# Patient Record
Sex: Male | Born: 1945 | Race: White | Hispanic: No | Marital: Married | Smoking: Never smoker
Health system: Southern US, Community
[De-identification: ages and names within clinical notes are randomized; demographics above are authoritative.]

---

## 2000-08-30 ENCOUNTER — Encounter: Payer: Self-pay | Admitting: Specialist

## 2000-08-30 ENCOUNTER — Ambulatory Visit (HOSPITAL_COMMUNITY): Admission: RE | Admit: 2000-08-30 | Discharge: 2000-08-30 | Payer: Self-pay | Admitting: Specialist

## 2008-10-12 ENCOUNTER — Inpatient Hospital Stay (HOSPITAL_COMMUNITY): Admission: RE | Admit: 2008-10-12 | Discharge: 2008-10-14 | Payer: Self-pay | Admitting: Orthopedic Surgery

## 2009-10-08 IMAGING — CR DG CHEST 2V
2 series · 2 of 2 positions shown · non-contrast
Comparison: None

CLINICAL DATA: Preoperative respiratory examination for left total
knee replacement 10/12/2008.

CHEST - 2 VIEW

[w chest pa]
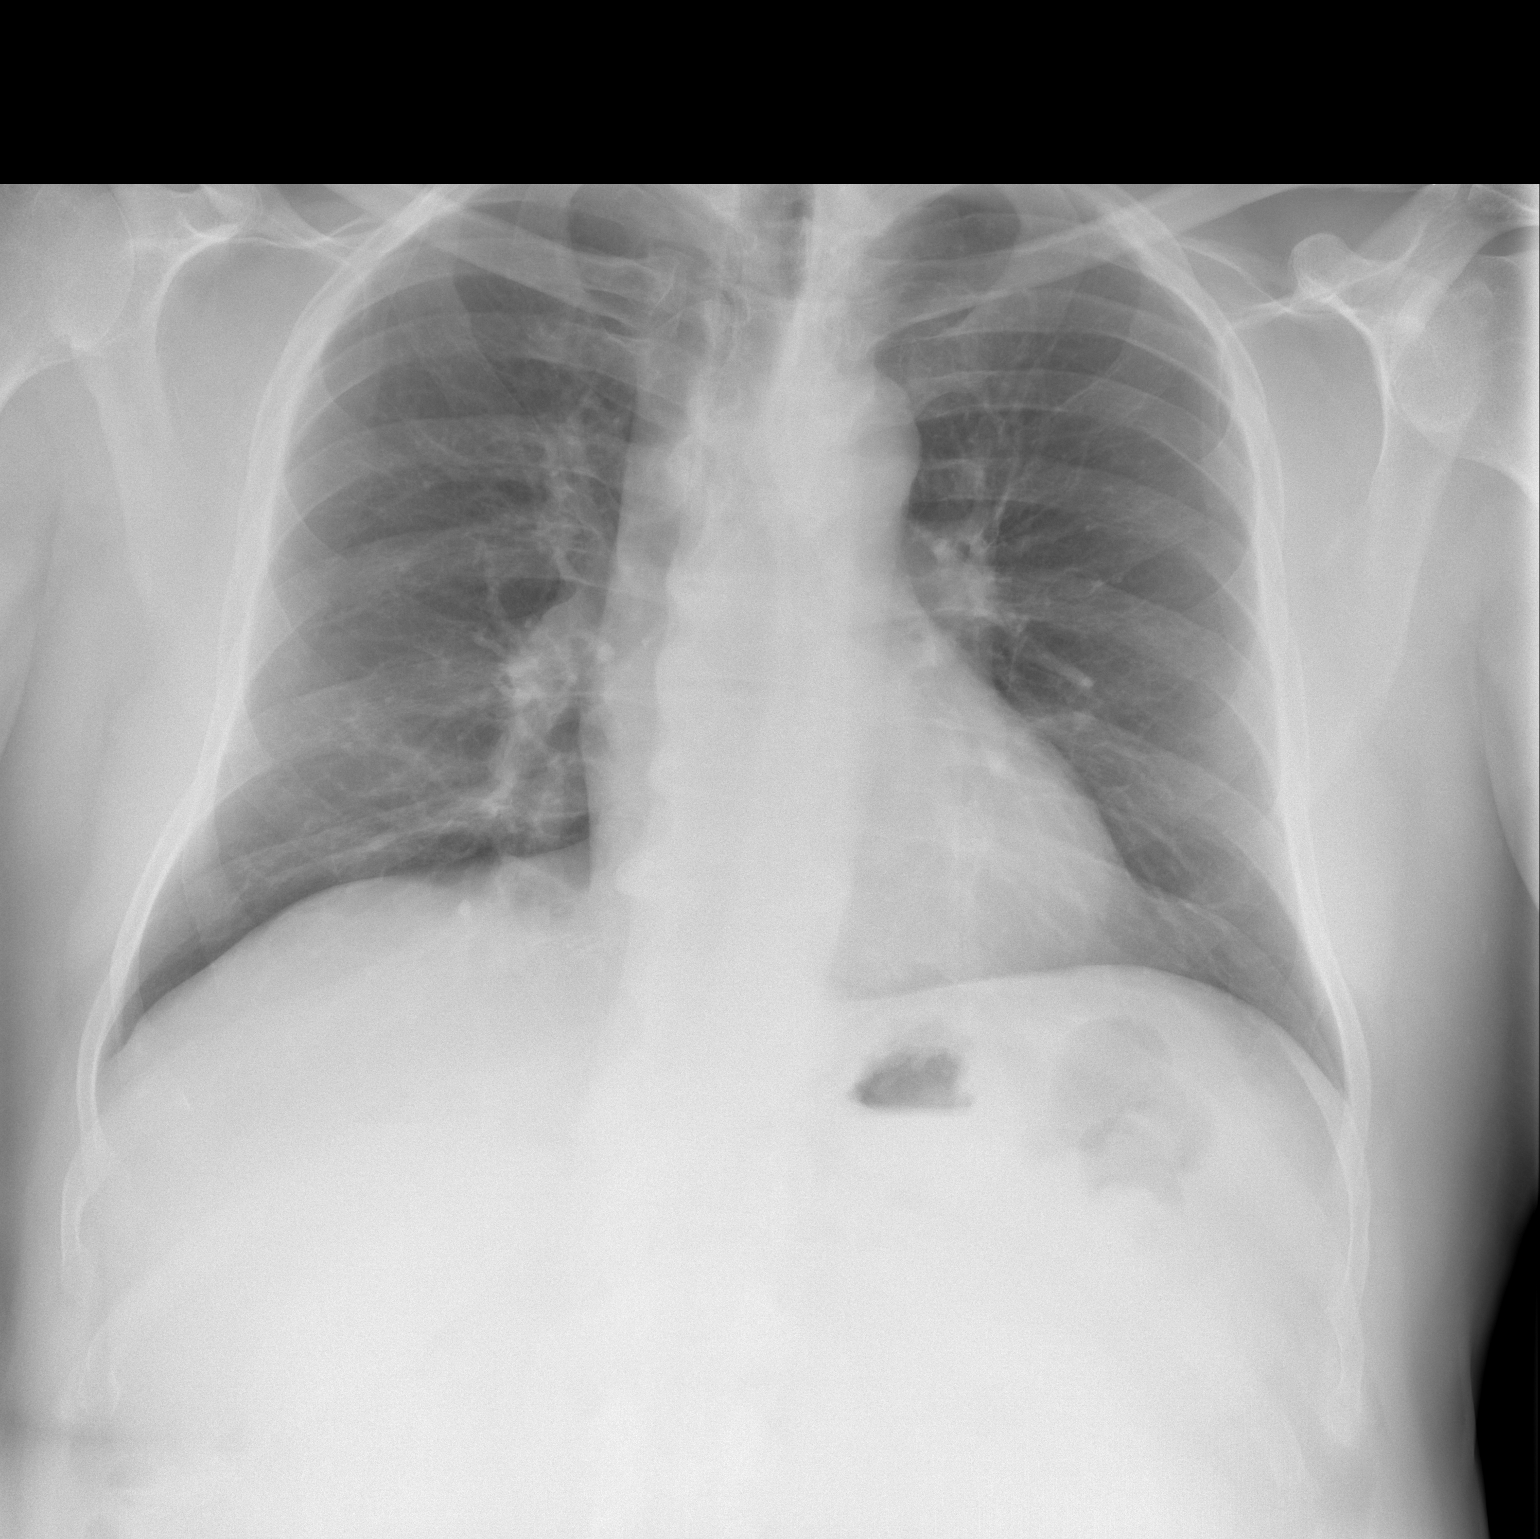

[w chest lat]
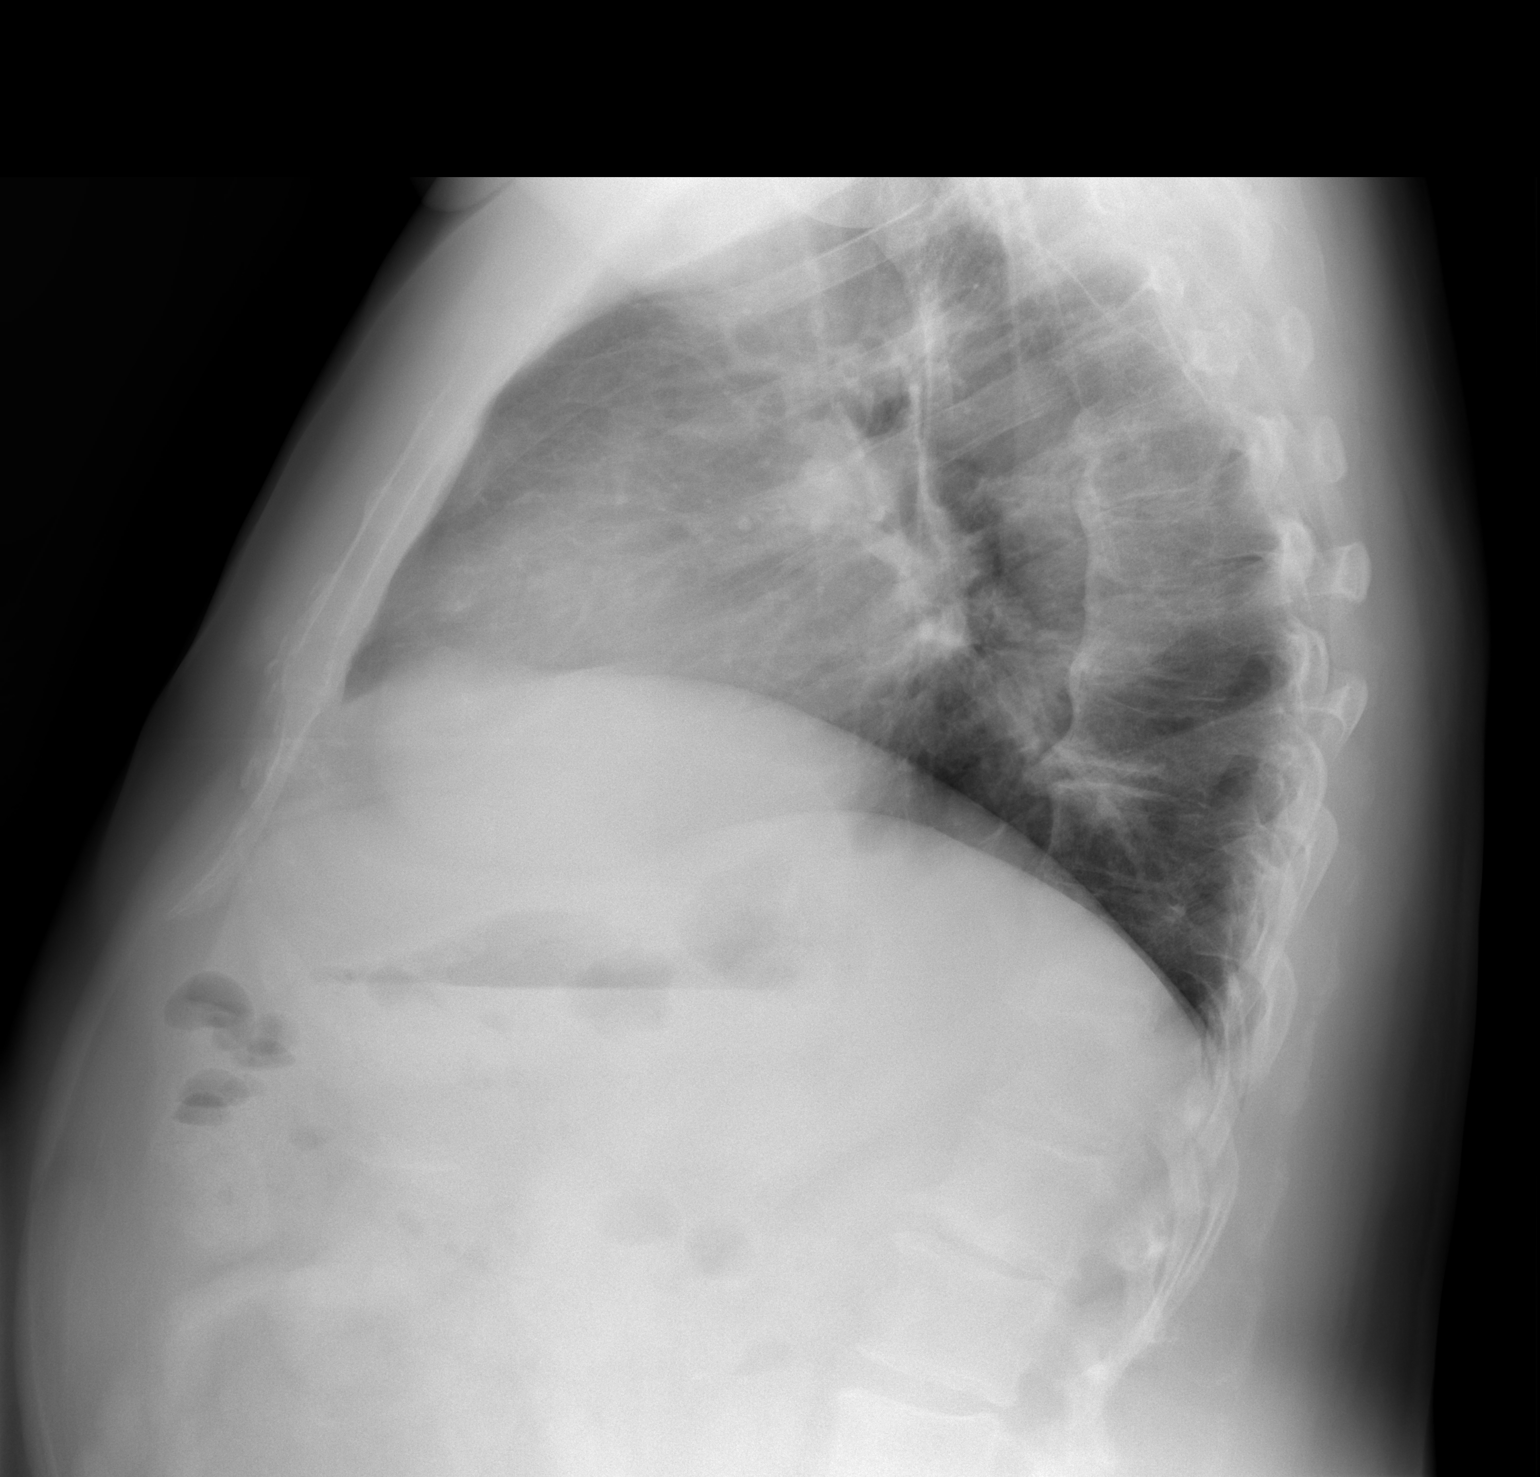

[2 of 2 positions shown; findings below may reference images not displayed]

FINDINGS: The heart size and mediastinal contours are normal for
age and the degree of inspiration.  The lungs are clear.  No
pleural effusion.  Scattered thoracic paraspinal osteophytes are
noted.
IMPRESSION: No active cardiopulmonary process.

## 2011-05-15 NOTE — Op Note (Signed)
Patrick Dawson, Patrick Dawson                 ACCOUNT NO.:  [id number]   MEDICAL RECORD NO.:  [id number]          PATIENT TYPE:  INP   LOCATION:  0004                         FACILITY:  [facility]   PHYSICIAN:  Madlyn Frankel. Charlann Boxer, M.D.  DATE OF BIRTH:  07/18/1946   DATE OF PROCEDURE:  10/12/2008  DATE OF DISCHARGE:                               OPERATIVE REPORT   PREOPERATIVE DIAGNOSIS:  Left knee osteoarthritis.   POSTOPERATIVE DIAGNOSIS:  Left knee osteoarthritis.   PROCEDURE:  Left total knee replacement.   COMPONENTS USED:  DePuy rotating platform posterior stabilized knee  system, size 4 femur, 5 tibia, 12.5 insert with 41 patellar button.   SURGEON:  Madlyn Frankel. Charlann Boxer, M.D.   ASSISTANT:  Yetta Glassman. Mann, PA   ANESTHESIA:  Duramorph spinal.   TOURNIQUET TIME:  45 minutes 250 mmHg.   DRAINS:  One Hemovac.   COMPLICATIONS:  None.   INDICATIONS FOR PROCEDURE:  Patrick Dawson is a pleasant 65 year old gentleman  who presented to the office with end-stage left greater than right  bilateral knee osteoarthritis with limitation of range of motion due to  severe osteophytes noted.  He had failed conservative measures and  wished to proceed with knee replacement surgery for the benefits of pain  relief.  Risks of infection, DVT postoperative stiffness, need for  revision surgery any purposes were all discussed and reviewed,  particularly given his age.  Consent was obtained.   PROCEDURE IN DETAIL:  The patient was brought to the operative theater.  Once adequate anesthesia, preoperative antibiotics, Ancef, 2 grams,  administered, the patient was positioned supine and the left thigh  tourniquet placed.  Left lower extremity was pre scrubbed and prepped  and draped in sterile fashion.  The leg was exsanguinated, tourniquet  elevated to 250 mmHg.  A midline incision was made followed by medial  arthrotomy.  Following initial debridement, attention was directed  patella.  Precut measurement was 26 mm.  I  resected down to  approximately 14 mm and used a 41 patellar button.  I checked with a  caliper afterwards and it sat at 26 mm.   Attention was now directed the femur.  Femoral canal was opened with  drill, irrigated to prevent fat emboli.  An intramedullary rod was then  passed in 3 degrees valgus.  I resected 11 mm bone off the distal femur  due to a 5 to 7 degree flexion contracture.   Following this cut, I attended to the tibia.  Tibia was subluxated  anteriorly.  Extramedullary guide was utilized and I resected 10 mm bone  off the proximal lateral tibia.  Following this cut, I checked the knee  in extension and was satisfied I was going to get the 10 mm insert  place, particularly knowing that there were significant osteophytes  posteriorly.   At this point, I checked the cut surface the tibia with a 4 tibial tray.  I was happy that the cut was perpendicular in both planes.  Once I  determined this, I attended to the femur again.   Femoral rotation was set  based off the proximal tibial cut, using a size  10 shim.  The rotation block was pinned in position and exchanged out  for the 4-in-1 cutting block.   I checked with a crab claw to make sure there would be no notching and  then made the anterior, posterior and chamfer cuts.  There were no  complications or notching.  Final box cut was made based off the lateral  aspect distal femur.  From anterior-posterior dimension, this patient  would tolerate a size 4 femur well.  Medial laterally it could have been  a little bit larger but I was happy with overall balancing at this point  and thus stayed with this size.   Attention was redirected back to the tibia.  Prior to performing the  tibial drilling and keel punch, I used a laminar spreader and used a 5/8  osteotome to remove large osteophytes off the posterior medial and  lateral aspect of the femur.  Once this was done and palpably clear of  any problems, I placed a size 5  tibial tray on the cut surfaces.  It fit  best along the cortical surface even after I debride osteophytes  medially.  I drilled and keel punched it and now did a trial reduction.  With a trial reduction I found a 12.5 insert felt better from extension  into flexion without evidence of any instability.  The patella tracked  through the trochlea without any application of pressure and no tilting.  At this point all trial components were removed.  Final debridement was  carried out as necessary.  The synovial capsule layer was injected with  0.25% Marcaine with epinephrine and 1 mL of Toradol.  The final  components were opened and cement mixed.   Final components were cemented in position.  The knee was brought to  extension with a 12.5 insert and extruded cement was removed.  Once  cement cured, I removed remaining cement out of the posterior aspect of  the knee.  Once I was satisfied there was no cement that would cause  problems, the final 12.5 insert to match the size 4 femur was inserted.  There appeared to be a good snap with the knee in flexion indicating  good gap balancing.   At this point we reirrigated the knee with normal saline solution with  pulse lavage.  I then placed a medium Hemovac drain deep.  The  tourniquet was let down at 45 minutes.  The extensor mechanism was then  reapproximated using #1 Vicryl with the knee in the flexion.  The  remainder of the wound was closed in layers with 2-0 Vicryl and running  4-0 Monocryl.  The knee was cleaned, dried and dressed sterilely with a  sterile bulky wrap.  He was then brought to the recovery room in stable  condition tolerating the procedure well.      Madlyn Frankel Charlann Boxer, M.D.  Electronically Signed     MDO/MEDQ  D:  10/12/2008  T:  10/12/2008  Job:  [id number]

## 2011-05-15 NOTE — H&P (Signed)
Patrick Dawson, Patrick Dawson                 ACCOUNT NO.:  [id number]   MEDICAL RECORD NO.:  [id number]          PATIENT TYPE:  INP   LOCATION:                               FACILITY:  [facility]   PHYSICIAN:  Madlyn Frankel. Charlann Boxer, M.D.  DATE OF BIRTH:  02-Feb-1946   DATE OF ADMISSION:  10/12/2008  DATE OF DISCHARGE:                              HISTORY & PHYSICAL   PROCEDURE:  Left total knee replacement.   CHIEF COMPLAINT:  Left knee pain.   HISTORY OF PRESENT ILLNESS:  A 65 year old male with a history of left  knee pain secondary to osteoarthritis with flexion contracture, limited  range of motion.  It has been refractory to all conservative treatment  including oral anti-inflammatories and cortisone injections.  He has  been presurgically assessed by his primary care physician, Dr. Lenn Sink.   PAST MEDICAL HISTORY:  Significant for:  1. Osteoarthritis.  2. Hypertension.  3. Heart attack in 2000.  4. Kidney stones.   PAST SURGICAL HISTORY:  1. Hernia repair in 1996.  2. Right foot surgery in 1968 and 1998.   FAMILY HISTORY:  Heart disease.   SOCIAL HISTORY:  Married, nonsmoker.  Primary caregiver in the home  postoperatively.   DRUG ALLERGIES:  TALWIN.   MEDICATIONS:  Unknown at the time of history and physical.  Will fax or  bring medications at the time of admission.   REVIEW OF SYSTEMS:  HEENT:  Has some hearing loss.  RESPIRATORY:  Has  shortness of breath on exertion.  MUSCULOSKELETAL:  Has morning  stiffness.  Otherwise, see HPI.   PHYSICAL EXAMINATION:  VITAL SIGNS:  Pulse 72, respirations 16, blood  pressure 132/78.  GENERAL:  Awake, alert and oriented.  Well-developed, well-nourished.  He is 5 feet, 10 inches, 240 pounds.  NECK:  Supple.  No carotid bruits.  CHEST:  Lungs clear to auscultation bilaterally.  BREASTS:  Deferred.  HEART:  Regular rate and rhythm.  S1, S2 distinct.  ABDOMEN:  Soft, nontender, nondistended.  Bowel sounds present.  GENITOURINARY:   Deferred.  EXTREMITIES:  Left lower extremity range of motion 3 to 90 with medial  sided tenderness.  SKIN:  No cellulitis.  NEUROLOGICAL:  Intact distal sensibilities.   LABORATORY DATA:  His labs, EKG and chest x-ray are all pending  presurgical testing.   IMPRESSION:  Left knee osteoarthritis.   PLAN OF ACTION:  Left total knee replacement at [facility]  October 12, 2008 by surgeon Dr. Durene Romans.  Risks and complications  were discussed.   Postoperative medications including Lovenox, Robaxin, iron, aspirin,  Colace, MiraLax were provided at the time of history and physical.  In  addition, Celebrex to be utilized perioperatively for one p.o. b.i.d. x2  weeks.     ______________________________  Yetta Glassman [address], [place]      Madlyn Frankel. Charlann Boxer, M.D.  Electronically Signed    BLM/MEDQ  D:  10/06/2008  T:  10/06/2008  Job:  [id number]   cc:   Fax  to Dr. Lenn Sink

## 2011-05-18 NOTE — Discharge Summary (Signed)
Patrick Dawson, FRANTA                 ACCOUNT NO.:  [id number]   MEDICAL RECORD NO.:  [id number]          PATIENT TYPE:  INP   LOCATION:  1618                         FACILITY:  [facility]   PHYSICIAN:  Madlyn Frankel. Charlann Boxer, M.D.  DATE OF BIRTH:  01/21/1946   DATE OF ADMISSION:  10/12/2008  DATE OF DISCHARGE:  10/14/2008                               DISCHARGE SUMMARY   ADMISSION DIAGNOSES:  1. Osteoarthritis.  2. Hypertension.  3. Heart attack in 2000.  4. Kidney stones.   DISCHARGE DIAGNOSES:  1. Osteoarthritis.  2. Hypertension.  3. Heart attack in 2000.  4. Kidney stones.  5. Postoperative hypokalemia.   HISTORY OF PRESENT ILLNESS:  A 65 year old male with a history of left  knee pain secondary to osteoarthritis with flexion contracture and  limited range of motion.  Refractory to all conservative treatment.   CONSULTS:  None.   PROCEDURE:  Left total knee replacement by surgeon, Dr. Durene Romans,  and assistant, Dwyane Luo, PA-C.   LABORATORY DATA:  CBC upon admission all within normal limits.  Final  reading:  White blood cells 6.6, hemoglobin 11.3, hematocrit 33.4,  platelets 167.  White cell differential with no significant  abnormalities upon admission.  Coagulation:  No significant  abnormalities.  Routine chemistry, final reading:  Sodium 141, potassium  3.4, glucose 122, creatinine 0.1.  Kidney function:  GFR remained  greater than 60.  Final calcium was 8.4.  UA was negative.   CARDIOLOGY:  EKG shows sinus bradycardia with first-degree AV block.  Nonspecific ST-T wave abnormalities.  No old tracing to compare to.   RADIOLOGY:  Chest two view:  No active cardiopulmonary disease.   HOSPITAL COURSE:  Patient admitted to the hospital, underwent left total  knee replacement, tolerated the procedure well.  Admitted to the  orthopedic floor.  He remained hemodynamically stable throughout his  course of stay, although he did have some mild hypokalemia.  His  dressing was  changed on a daily basis after day #1 when the Hemovac was  discontinued intact.  He was neurovascularly intact to his left lower  extremity throughout and was able to do a straight leg raise.  He was  weightbearing as tolerated.  He did great with physical therapy while in  the hospital.  He was started on Celebrex and Lovenox on day #1.   On day #2, he had some soreness.  His operative dressing was changed.  The wound looked great with no active drainage.  Neurovascularly intact  with home health care selected.  We gave him a little bit of K-Dur  before he left.   DISPOSITION:  Discharge home with home health care PT in stable and  improved condition.   DISCHARGE DIET:  Regular.   DISCHARGE WOUND CARE:  Keep dry.   DISCHARGE PHYSICAL THERAPY:  Weightbearing as tolerated with the use of  a rolling walker.   DISCHARGE MEDICATIONS:  1. Lovenox 40 mg subcu q.24h. x11 days.  2. Robaxin 500 mg p.o. q.6h.  3. Iron 325 mg p.o. t.i.d. x2 weeks.  4. External carotid artery  325 mg 1 p.o. daily x4 weeks after Lovenox      was completed.  5. Colace 100 mg 1 p.o. b.i.d. p.r.n. constipation while on narcotics.  6. MiraLax 17 gm p.o. daily p.r.n. constipation while on narcotics.  7. Celebrex 200 mg 1 p.o. b.i.d. x2 weeks.  8. Vicodin 7.5/325 1-2 p.o. q.4-6h. p.r.n. pain.  9. HCTZ 25 with lisinopril 20 mg 2 p.o. daily.  10.Amlodipine 10 mg 1 p.o. daily.  11.Furosemide 20 mg 1 p.o. daily.  12.Omeprazole 20 mg 1 p.o. daily.  13.Metoprolol 50 mg 2 p.o. daily.   DISCHARGE FOLLOWUP:  Follow up with Dr. Charlann Boxer at phone number [phone] in  two weeks.     ______________________________  Yetta Glassman [address], [place]      Madlyn Frankel. Charlann Boxer, M.D.  Electronically Signed    BLM/MEDQ  D:  10/28/2008  T:  10/28/2008  Job:  [id number]   cc:   Dr. Lenn Sink

## 2011-10-02 LAB — BASIC METABOLIC PANEL
BUN: 10
BUN: 14
CO2: 29
CO2: 31
CO2: 35 — ABNORMAL HIGH
Calcium: 8.4
Calcium: 9
Chloride: 102
Chloride: 106
Creatinine, Ser: 0.81
Creatinine, Ser: 0.87
Creatinine, Ser: 0.93
GFR calc Af Amer: >60
GFR calc Af Amer: >60
GFR calc Af Amer: >60
GFR calc non Af Amer: >60
Glucose, Bld: 115 — ABNORMAL HIGH
Glucose, Bld: 122 — ABNORMAL HIGH
Potassium: 3.5
Sodium: 139

## 2011-10-02 LAB — CBC
HCT: 42
Hemoglobin: 13.9
MCHC: 33.1
MCHC: 33.6
MCHC: 33.9
MCV: 89.3
MCV: 89.7
Platelets: 167
Platelets: 227
RBC: 3.45 — ABNORMAL LOW
RBC: 3.69 — ABNORMAL LOW
RBC: 4.7
RDW: 13.9
RDW: 14.3
WBC: 4.6

## 2011-10-02 LAB — URINALYSIS, ROUTINE W REFLEX MICROSCOPIC
Bilirubin Urine: NEGATIVE
Glucose, UA: NEGATIVE
Hgb urine dipstick: NEGATIVE
Ketones, ur: NEGATIVE
Nitrite: NEGATIVE
Protein, ur: NEGATIVE
Specific Gravity, Urine: 1.009
Urobilinogen, UA: 0.2
pH: 6.5

## 2011-10-02 LAB — DIFFERENTIAL
Basophils Absolute: 0
Basophils Relative: 1
Eosinophils Absolute: 0.3
Eosinophils Relative: 6 — ABNORMAL HIGH

## 2011-10-02 LAB — TYPE AND SCREEN
ABO/RH(D): A POS
Antibody Screen: NEGATIVE

## 2011-10-02 LAB — PROTIME-INR: Prothrombin Time: 13.5

## 2017-08-26 ENCOUNTER — Encounter: Payer: Self-pay | Admitting: Podiatry

## 2017-08-26 ENCOUNTER — Encounter (INDEPENDENT_AMBULATORY_CARE_PROVIDER_SITE_OTHER): Payer: Self-pay

## 2017-08-26 ENCOUNTER — Ambulatory Visit (INDEPENDENT_AMBULATORY_CARE_PROVIDER_SITE_OTHER): Payer: Medicare HMO | Admitting: Podiatry

## 2017-08-26 ENCOUNTER — Ambulatory Visit (INDEPENDENT_AMBULATORY_CARE_PROVIDER_SITE_OTHER): Payer: Medicare HMO

## 2017-08-26 VITALS — BP 148/82 | HR 92 | Ht 70.0 in | Wt 262.0 lb

## 2017-08-26 DIAGNOSIS — M19079 Primary osteoarthritis, unspecified ankle and foot: Secondary | ICD-10-CM

## 2017-08-26 DIAGNOSIS — M109 Gout, unspecified: Secondary | ICD-10-CM | POA: Diagnosis not present

## 2017-08-26 DIAGNOSIS — M19071 Primary osteoarthritis, right ankle and foot: Secondary | ICD-10-CM

## 2017-08-26 LAB — HEPATIC FUNCTION PANEL
ALK PHOS: 55 IU/L (ref 39–117)
ALT: 22 IU/L (ref 0–44)
AST: 22 IU/L (ref 0–40)
Albumin: 4.1 g/dL (ref 3.6–4.8)
BILIRUBIN, DIRECT: 0.13 mg/dL (ref 0.00–0.40)
Bilirubin Total: 0.5 mg/dL (ref 0.0–1.2)
Total Protein: 6.3 g/dL (ref 6.0–8.5)

## 2017-08-26 MED ORDER — MELOXICAM 15 MG PO TABS: 15.0000 mg | ORAL_TABLET | Freq: Every day | ORAL | 0 refills | Status: AC

## 2017-08-26 NOTE — Progress Notes (Signed)
Subjective:  Patient ID: Patrick Dawson, male    DOB: September 16, 1946,  MRN: [id number] HPI Chief Complaint  Patient presents with  . Foot Pain    right foot pain hx of cellulitis can't put any pressure on the foot x-rays at [facility] no fx arthritic changes     71 y.o. male presents with the above complaint. Was seen at [facility] 8/13 for cellulitis of R foot. XR were taken. States that redness was confined only to his midfoot at first, but has now additionally occurred in his great toe. Reports he is unable to tolerate bearing weight to the R foot. States the antibiotics he was given did not significantly decrease the redness to his foot. Reports his foot was much more red yesterday than today due to doing a lot of activity. Patient denies being diabetic, though he takes metformin. Last A1c 07/11/17 was 6.4. Endorses history of prior gout attacks in his great toes. Cannot recall increased consumption of known gout promoting foods recently.  PMH: DM, HTN PSH: Reports R 5th met fracture ORIF x18 years ago.  Current Outpatient Prescriptions:  .  amLODipine (NORVASC) 10 MG tablet, Take 10 mg by mouth., Disp: , Rfl:  .  aspirin 81 MG chewable tablet, Chew 81 mg by mouth., Disp: , Rfl:  .  Cholecalciferol (VITAMIN D3) 1000 units CAPS, Take by mouth., Disp: , Rfl:  .  ELIQUIS 2.5 MG TABS tablet, , Disp: , Rfl:  .  furosemide (LASIX) 20 MG tablet, , Disp: , Rfl:  .  lisinopril (PRINIVIL,ZESTRIL) 40 MG tablet, , Disp: , Rfl:  .  metFORMIN (GLUCOPHAGE-XR) 500 MG 24 hr tablet, , Disp: , Rfl:  .  metoprolol succinate (TOPROL-XL) 50 MG 24 hr tablet, , Disp: , Rfl:  .  omeprazole (PRILOSEC) 20 MG capsule, TAKE 1 CAPSULE EVERY DAY  (NEED AN APPOINTMENT FOR FURTHER REFILLS), Disp: , Rfl:  .  potassium chloride SA (K-DUR,KLOR-CON) 20 MEQ tablet, TAKE 1 TABLET EVERY DAY (NEED APPOINTMENT FOR FURTHER REFILLS), Disp: , Rfl:  .  pravastatin (PRAVACHOL) 20 MG tablet, , Disp: , Rfl:  .  spironolactone (ALDACTONE)  25 MG tablet, , Disp: , Rfl:  .  meloxicam (MOBIC) 15 MG tablet, Take 1 tablet (15 mg total) by mouth daily., Disp: 21 tablet, Rfl: 0  Allergies  Allergen Reactions  . Pentazocine Nausea And Vomiting   Review of Systems as per HPI. Objective:   Vitals:   08/26/17 0954  BP: (!) 148/82  Pulse: 92   General AA&O x3. Normal mood and affect.  Vascular Dorsalis pedis and posterior tibial pulses 1/4 bilat. Brisk capillary refill to all digits. Pedal hair diminished.  Neurologic Epicritic sensation grossly intact. SWMF 5/5 bilat. Vibratory sensation intact to the hallux bilat.  Dermatologic No open lesions. Interspaces clear of maceration. Nails well groomed and normal in appearance. Slight erythema dorsal midfoot R foot, R hallux. No fluctuance or crepitus.  Orthopedic: MMT 5/5 in dorsiflexion, plantarflexion, inversion, and eversion. Normal joint ROM. Tender to palpation R midfoot.   Radiographs: Midfoot DJD with hazy appearance suggestive of possible erosion. Medial marginal erosions noted to the proximal phalanx of the hallux.  Outside records reviewed: Labs from [place] to personally review XRs from [place]:  Patient was evaluated and treated and all questions answered.  Arthritis, Midfoot; with 1st MPJ pain; ? Gout -Does not appear to be infectious process since symptoms are not constant, are worsened by activity, and are  improved by anti-inflammatory medication but not significantly improved by antibiotics. - Favor possible metabolic cause. Chacot ruled out 2/2 sensate foot. -Uric acid labs ordered to r/o gout. -Patient to wear CAM boot when he is able.  -Unable to Rx Colchicine d/t interaction with pravastatin. -If symptoms persist at next visit will consider MRI.  30 minutes of face to face time were spent with the patient. >50% of this was spent on counseling and coordination of care of the above diagnoses. Specifically discussed  with patient the the likely etiologies behind his condition and that infectious process is not the leading differential at this time. Patient verbalized understanding of treatment plan.  Return in about 2 weeks (around 09/09/2017).

## 2017-08-27 LAB — URIC ACID: Uric Acid: 7.8 mg/dL (ref 3.7–8.6)

## 2017-09-10 ENCOUNTER — Ambulatory Visit (INDEPENDENT_AMBULATORY_CARE_PROVIDER_SITE_OTHER): Payer: Medicare HMO | Admitting: Podiatry

## 2017-09-10 DIAGNOSIS — M19079 Primary osteoarthritis, unspecified ankle and foot: Secondary | ICD-10-CM | POA: Diagnosis not present

## 2017-09-10 DIAGNOSIS — M109 Gout, unspecified: Secondary | ICD-10-CM

## 2017-09-10 NOTE — Progress Notes (Signed)
   Subjective:    Patient ID: Patrick Dawson, male    DOB: June 14, 1946, 71 y.o.   MRN: [id number]  HPI Chief Complaint  Patient presents with  . Foot Pain    Right is much better had a flare on Sunday    71 y.o. male returns for the above complaint. States that he noticed an immediate relief in his symptoms with starting the Meloxicam. Had another flare up this past Saturday but it resolved Sunday. States his wife gave him cheese which he thinks may have provoked the most recent flare. Otherwise feeling much better, now able to bear weight on the foot without pain.  Review of Systems    Objective:   Physical Exam There were no vitals filed for this visit. General AA&O x3. Normal mood and affect.  Vascular Dorsalis pedis and posterior tibial pulses  present 1+ bilaterally  Capillary refill normal to all digits. Pedal hair growth diminished. Pitting edema noted bilat feet and legs.  Neurologic Epicritic sensation grossly present.  Dermatologic No open lesions. Interspaces clear of maceration. Resolved erythema.  Orthopedic: To pain to palpation R hallux or R midfoot.     Assessment & Plan:  Gouty Arthritis, R Foot -Immediate relief with NSAIDs confirms inflammatory rather than infectious process. -Uric acid labs reviewed. Normal but discussed that this does not rule out gout. -Educated again on diet precautions in gout.

## 2023-01-02 ENCOUNTER — Ambulatory Visit: Payer: Medicare HMO | Admitting: Internal Medicine

## 2023-01-02 VITALS — BP 144/68 | HR 60 | Temp 97.7°F | Resp 19 | Ht 68.1 in | Wt 242.3 lb

## 2023-01-02 DIAGNOSIS — T782XXA Anaphylactic shock, unspecified, initial encounter: Secondary | ICD-10-CM

## 2023-01-02 NOTE — Progress Notes (Unsigned)
New Patient Note  RE: Patrick Dawson MRN: [id number] DOB: 03/27/1946 Date of Office Visit: 01/02/2023  Consult requested by: Raina Mina., MD Primary care provider: Raina Mina., MD  Chief Complaint: Allergic Reaction (Pt states a 1.5 month ago he some trail mix a different brand than he usually eats and his tongue, upper lip, nose and some swallowing issues but was hospitalized overnight epi was given.)  History of Present Illness: I had the pleasure of seeing Patrick Dawson for initial evaluation at the [facility] on 01/03/2023. He is a 77 y.o. male, who is referred here by Raina Mina., MD for the evaluation of nut allergy.  History obtained from patient, chart review.  He reports losing 30-40lbs intentionally over the past year and he has been eating more nuts.  3 times he has noticed stomach pain and diarrhea then next day after eating   Concern for Food Allergy:  Food of concern: nuts  History of reaction: 1.5 months ago he ate a handful of trail mix containing mixed nuts, dried fruits (pinapple, banana, raisens) and and within 4 hours stomach pain, diarrhea, followed by dysphagia, tongue, lip and nose angioedema.  He went to UC where he was treated  Previous allergy testing no Eats egg, dairy, wheat, soy, fish, shellfish, sesame without reactions Carries an epinephrine autoinjector: yes Has food allergy action plan no   Assessment and Plan: Patrick Dawson is a 77 y.o. male with: Anaphylaxis, initial encounter - Plan: IgE Nut Prof. w/Component Rflx, Allergen, Pineapple, f210, Allergy Test Plan: Patient Instructions  Food allergy:  - today's skin testing was positive to pineapple only, negative to all other foods and environmentals  - please strictly avoid pineapple and all nuts, we will recheck with blood work  - okay to resume eating banana  - for SKIN only reaction, okay to take Benadryl 1 capsules every 6 hours - for SKIN + ANY additional symptoms, OR  IF concern for LIFE THREATENING reaction = Epipen Autoinjector EpiPen 0.3 mg. - If using Epinephrine autoinjector, call 911 - A food allergy action plan has been provided and discussed. - Medic Alert identification is recommended.  Follow up: 6 months We will contact you with the blood work and go from there  Thank you so much for letting me partake in your care today.  Don't hesitate to reach out if you have any additional concerns!  Roney Marion, MD  [facility] [place], [place]    No orders of the defined types were placed in this encounter.  Lab Orders         IgE Nut Prof. w/Component Rflx         Allergen, Pineapple, f210      Other allergy screening: Asthma: no Rhino conjunctivitis: no Food allergy: yes Medication allergy: no Hymenoptera allergy: no Urticaria: no Eczema:no History of recurrent infections suggestive of immunodeficency: no  Diagnostics: Skin Testing: Environmental allergy panel and select foods.  pineapple only, negative to all other foods and environmentals  Results interpreted by myself and discussed with patient/family.  Airborne Adult Perc - 01/02/23 1537     Time Antigen Placed 1537    Allergen Manufacturer Lavella Hammock    Location Back    Number of Test 58    1. Control-Buffer 50% Glycerol Negative    2. Control-Histamine 1 mg/ml 4+    3. Albumin saline Negative    4. [place] Negative    5. [place] Negative  6. Johnson Negative    7. Bowman Blue Negative    9. Perennial Rye Negative    10. Sweet Vernal Negative    11. Timothy Negative    12. Cocklebur Negative    13. Burweed Marshelder Negative    14. Ragweed, short Negative    15. Ragweed, Giant Negative    16. Plantain,  English Negative    17. Lamb's Quarters Negative    18. Sheep Sorrell Negative    19. Rough Pigweed Negative    20. Marsh Elder, Rough Negative    21. Mugwort, Common Negative    22. Ash mix Negative    23. Birch mix Negative    24. Beech  American Negative    25. Box, Elder Negative    26. Cedar, red Negative    27. Cottonwood, [place] Negative    28. Elm mix Negative    29. Hickory Negative    30. Maple mix Negative    31. Oak, [place] mix Negative    32. Pecan Pollen Negative    33. Pine mix Negative    34. Sycamore Eastern Negative    35. [place], Black Pollen Negative    36. Alternaria alternata Negative    37. Cladosporium Herbarum Negative    38. Aspergillus mix Negative    39. Penicillium mix Negative    40. Bipolaris sorokiniana (Helminthosporium) Negative    41. Drechslera spicifera (Curvularia) Negative    42. Mucor plumbeus Negative    43. Fusarium moniliforme Negative    44. Aureobasidium pullulans (pullulara) Negative    45. Rhizopus oryzae Negative    46. Botrytis cinera Negative    47. Epicoccum nigrum Negative    48. Phoma betae Negative    49. Candida Albicans Negative    50. Trichophyton mentagrophytes Negative    51. Mite, D Farinae  5,000 AU/ml Negative    52. Mite, D Pteronyssinus  5,000 AU/ml Negative    53. Cat Hair 10,000 BAU/ml Negative    54.  Dog Epithelia Negative    55. Mixed Feathers Negative    56. Horse Epithelia Negative    57. Cockroach, German Negative    58. Mouse Negative    59. Tobacco Leaf Negative    Comments n             Food Adult Perc - 01/02/23 1500     Time Antigen Placed 1539    Allergen Manufacturer Lavella Hammock    Location Back    Number of allergen test 11    1. Peanut Negative    10. Cashew Negative    11. Pecan Food Negative    12. [place] Negative    13. Almond Negative    14. Hazelnut Negative    15. [place] nut Negative    16. Coconut Negative    17. Pistachio Negative    57. Banana Negative    63. 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 No pertinent past medical history. Past Surgical History: History reviewed. No  pertinent surgical history. Medication List:  Current Outpatient Medications  Medication Sig Dispense Refill   allopurinol (ZYLOPRIM) 100 MG tablet Take 1 tablet by mouth daily.     amLODipine (NORVASC) 10 MG tablet Take 10 mg by mouth.     aspirin 81 MG chewable tablet Chew 81 mg by  mouth.     Cholecalciferol (VITAMIN D3) 1000 units CAPS Take by mouth.     ELIQUIS 2.5 MG TABS tablet      EPINEPHrine 0.3 mg/0.3 mL IJ SOAJ injection Inject into the muscle.     famotidine (PEPCID) 20 MG tablet      furosemide (LASIX) 20 MG tablet      lisinopril (PRINIVIL,ZESTRIL) 40 MG tablet      meloxicam (MOBIC) 15 MG tablet Take 1 tablet (15 mg total) by mouth daily. 21 tablet 0   metFORMIN (GLUCOPHAGE-XR) 500 MG 24 hr tablet      metoprolol succinate (TOPROL-XL) 50 MG 24 hr tablet      omeprazole (PRILOSEC) 20 MG capsule TAKE 1 CAPSULE EVERY DAY  (NEED AN APPOINTMENT FOR FURTHER REFILLS)     potassium chloride SA (K-DUR,KLOR-CON) 20 MEQ tablet TAKE 1 TABLET EVERY DAY (NEED APPOINTMENT FOR FURTHER REFILLS)     pravastatin (PRAVACHOL) 20 MG tablet      spironolactone (ALDACTONE) 25 MG tablet      No current facility-administered medications for this visit.   Allergies: Allergies  Allergen Reactions   Pentazocine Nausea And Vomiting   Social History: Social History   Socioeconomic History   Marital status: Married    Spouse name: Not on file   Number of children: Not on file   Years of education: Not on file   Highest education level: Not on file  Occupational History   Not on file  Tobacco Use   Smoking status: Never   Smokeless tobacco: Never  Substance and Sexual Activity   Alcohol use: Not on file   Drug use: Not on file   Sexual activity: Not on file  Other Topics Concern   Not on file  Social History Narrative   Not on file   Social Determinants of Health   Financial Resource Strain: Not on file  Food Insecurity: Not on file  Transportation Needs: Not on file  Physical  Activity: Not on file  Stress: Not on file  Social Connections: Not on file   Lives in a single-family home built in 2017.  There are no roaches in the house and bed is 2 feet off the floor.  There are no dust mite precautions on bed or pillows.  He is not exposed to fumes, chemicals or dust.  There is no HEPA filter in the home and home is not near an interstate industrial area. Smoking: No exposure Occupation: Retired, prior [employer] veteran  Environmental History: Immunologist in the house: no Engineer, civil (consulting) in the family room: no Carpet in the bedroom: no Heating: heat pump Cooling: central Pet: yes 1 cat and 2 dogs with access to bedroom  Family History: History reviewed. No pertinent family history.   ROS: All others negative except as noted per HPI.   Objective: BP (!) 144/68   Pulse 60   Temp 97.7 F (36.5 C) (Temporal)   Resp 19   Ht 5' 8.1" (1.73 m)   Wt 242 lb 4.8 oz (109.9 kg)   SpO2 97%   BMI 36.73 kg/m  Body mass index is 36.73 kg/m.  General Appearance:  Alert, cooperative, no distress, appears stated age  Head:  Normocephalic, without obvious abnormality, atraumatic  Eyes:  Conjunctiva clear, EOM's intact  Nose: Nares normal,  erythematous nasal mucosa, hypertrophic turbinates, no visible anterior polyps, and septum midline  Throat: Lips, tongue normal; teeth and gums normal, normal posterior oropharynx  Neck: Supple, symmetrical  Lungs:  clear to auscultation bilaterally, Respirations unlabored, no coughing  Heart:  regular rate and rhythm and no murmur, Appears well perfused  Extremities: No edema  Skin: Skin color, texture, turgor normal, no rashes or lesions on visualized portions of skin  Neurologic: No gross deficits   The plan was reviewed with the patient/family, and all questions/concerned were addressed.  It was my pleasure to see Patrick Dawson today and participate in his care. Please feel free to contact me with any questions or  concerns.  Sincerely,  Roney Marion, MD Allergy & Immunology  [facility] office: [phone] [facility] office: [phone]

## 2023-01-02 NOTE — Patient Instructions (Signed)
Food allergy:  - today's skin testing was positive to pineapple only, negative to all other foods and environmentals  - please strictly avoid pineapple and all nuts, we will recheck with blood work  - okay to resume eating banana  - for SKIN only reaction, okay to take Benadryl 1 capsules every 6 hours - for SKIN + ANY additional symptoms, OR IF concern for LIFE THREATENING reaction = Epipen Autoinjector EpiPen 0.3 mg. - If using Epinephrine autoinjector, call 911 - A food allergy action plan has been provided and discussed. - Medic Alert identification is recommended.  Follow up: 6 months We will contact you with the blood work and go from there  Thank you so much for letting me partake in your care today.  Don't hesitate to reach out if you have any additional concerns!  Roney Marion, MD  [facility] [place]

## 2023-01-03 ENCOUNTER — Encounter: Payer: Self-pay | Admitting: Internal Medicine

## 2023-01-05 LAB — IGE NUT PROF. W/COMPONENT RFLX
F017-IgE Hazelnut (Filbert): <0.1 kU/L
F018-IgE Brazil Nut: <0.1 kU/L
F020-IgE Almond: <0.1 kU/L
F202-IgE Cashew Nut: <0.1 kU/L
F203-IgE Pistachio Nut: <0.1 kU/L
F256-IgE Walnut: <0.1 kU/L
Macadamia Nut, IgE: <0.1 kU/L
Peanut, IgE: 0.14 kU/L — AB
Pecan Nut IgE: <0.1 kU/L

## 2023-01-05 LAB — PEANUT COMPONENTS
F352-IgE Ara h 8: <0.1 kU/L
F422-IgE Ara h 1: <0.1 kU/L
F423-IgE Ara h 2: <0.1 kU/L
F424-IgE Ara h 3: <0.1 kU/L
F427-IgE Ara h 9: <0.1 kU/L
F447-IgE Ara h 6: <0.1 kU/L

## 2023-01-05 LAB — ALLERGEN, PINEAPPLE, F210: Pineapple IgE: <0.1 kU/L

## 2023-01-05 LAB — ALLERGEN COMPONENT COMMENTS

## 2023-01-07 NOTE — Progress Notes (Signed)
Blood work was only slightly positive to peanut.  With negative skin testing and at the level where the peanut is now I recommend a food challenge to peanuts to better evaluate this food allergy.  Can someone let patient know and gauge  is interested for a food challeng?

## 2023-01-10 ENCOUNTER — Telehealth: Payer: Self-pay

## 2023-01-10 ENCOUNTER — Encounter: Payer: Self-pay | Admitting: *Deleted

## 2023-01-10 ENCOUNTER — Telehealth: Payer: Self-pay | Admitting: Internal Medicine

## 2023-01-10 NOTE — Telephone Encounter (Signed)
Spoke to his wife, ok to mail lab results to patients home. I did highlight on lab forms that if he wants to do a peanut challenge to call office.

## 2023-01-10 NOTE — Telephone Encounter (Signed)
Mailed out patients lab results

## 2023-01-10 NOTE — Telephone Encounter (Signed)
Patient returning call for lab results please advise

## 2023-01-11 NOTE — Telephone Encounter (Signed)
Tried calling back- left a message for a call back. We have previously tried reaching him 3 times and a letter was mailed.

## 2023-07-03 ENCOUNTER — Ambulatory Visit: Payer: Medicare HMO | Admitting: Internal Medicine
